# Patient Record
Sex: Female | Born: 1995 | Race: White | Hispanic: No | Marital: Single | Smoking: Never smoker
Health system: Southern US, Community
[De-identification: ages and names within clinical notes are randomized; demographics above are authoritative.]

## PROBLEM LIST (undated history)

## (undated) DIAGNOSIS — G40909 Epilepsy, unspecified, not intractable, without status epilepticus: Secondary | ICD-10-CM

---
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
## 2018-01-30 ENCOUNTER — Other Ambulatory Visit: Payer: Self-pay

## 2018-01-30 ENCOUNTER — Emergency Department (HOSPITAL_BASED_OUTPATIENT_CLINIC_OR_DEPARTMENT_OTHER): Payer: Worker's Compensation

## 2018-01-30 ENCOUNTER — Encounter (HOSPITAL_BASED_OUTPATIENT_CLINIC_OR_DEPARTMENT_OTHER): Payer: Self-pay | Admitting: Adult Health

## 2018-01-30 ENCOUNTER — Emergency Department (HOSPITAL_BASED_OUTPATIENT_CLINIC_OR_DEPARTMENT_OTHER)
Admission: EM | Admit: 2018-01-30 | Discharge: 2018-01-30 | Disposition: A | Discharge status: Home / Self Care | Payer: Worker's Compensation | Attending: Emergency Medicine | Admitting: Emergency Medicine

## 2018-01-30 DIAGNOSIS — S060X0A Concussion without loss of consciousness, initial encounter: Secondary | ICD-10-CM | POA: Diagnosis not present

## 2018-01-30 DIAGNOSIS — S0990XA Unspecified injury of head, initial encounter: Secondary | ICD-10-CM | POA: Diagnosis present

## 2018-01-30 DIAGNOSIS — Y9389 Activity, other specified: Secondary | ICD-10-CM | POA: Diagnosis not present

## 2018-01-30 DIAGNOSIS — W01198A Fall on same level from slipping, tripping and stumbling with subsequent striking against other object, initial encounter: Secondary | ICD-10-CM | POA: Insufficient documentation

## 2018-01-30 DIAGNOSIS — Y929 Unspecified place or not applicable: Secondary | ICD-10-CM | POA: Insufficient documentation

## 2018-01-30 DIAGNOSIS — Y990 Civilian activity done for income or pay: Secondary | ICD-10-CM | POA: Insufficient documentation

## 2018-01-30 HISTORY — DX: Epilepsy, unspecified, not intractable, without status epilepticus: G40.909

## 2018-01-30 NOTE — ED Triage Notes (Signed)
WHile working today for [employer], pt was in [employer] and ran into another coworker, it knocked her backwards and to the ground where the occiput of her head hit twice/ She endorses nasuea, increased pain and dizziness.

## 2018-01-30 NOTE — ED Provider Notes (Signed)
[facility] EMERGENCY DEPARTMENT Provider Note   CSN: [id number] Arrival date & time: 01/30/18  1616     History   Chief Complaint Chief Complaint  Patient presents with  . Head Injury    HPI Lollie MarrowBrittany Reppert is a 22 y.o. female.  HPI Patient presents after a fall in which she had her head.  States she bumped into someone at work and fell back, striking her head twice on the ground.  Since then has had a headache some nausea photophobia.  Also feels dizzy.  No confusion.  No vision changes.  No neck pain.  Mild mid back pain. Past Medical History:  Diagnosis Date  . Epilepsy (HCC)     There are no active problems to display for this patient.   History reviewed. No pertinent surgical history.   OB History   None      Home Medications    Prior to Admission medications   Medication Sig Start Date End Date Taking? Authorizing Provider  ibuprofen (ADVIL,MOTRIN) 200 MG tablet Take 400 mg by mouth every 6 (six) hours as needed. Patient used this medication for her headache.    [provider]    Family History History reviewed. No pertinent family history.  Social History Social History   Tobacco Use  . Smoking status: Never Smoker  . Smokeless tobacco: Never Used  Substance Use Topics  . Alcohol use: No  . Drug use: Not on file     Allergies   Patient has no known allergies.   Review of Systems Review of Systems  Constitutional: Negative for appetite change and fever.  HENT: Negative for congestion.   Eyes: Positive for photophobia.  Cardiovascular: Negative for chest pain.  Gastrointestinal: Positive for nausea. Negative for abdominal pain.  Genitourinary: Negative for enuresis.  Musculoskeletal: Positive for back pain.  Skin: Negative for rash.  Neurological: Positive for dizziness and headaches.  Hematological: Negative for adenopathy.  Psychiatric/Behavioral: Negative for confusion.     Physical Exam Updated Vital  Signs BP 121/81   Pulse 75   Temp 98.3 F (36.8 C) (Oral)   Resp 16   Ht 5\' 3"  (1.6 m)   Wt 64.9 kg (143 lb)   LMP 01/29/2018 (Exact Date)   SpO2 100%   BMI 25.33 kg/m   Physical Exam  Constitutional: She appears well-developed.  HENT:  Head: Normocephalic.  occipital tenderness.  Eyes: Pupils are equal, round, and reactive to light. EOM are normal.  Neck: Neck supple.  Cardiovascular: Normal rate.  Pulmonary/Chest: Effort normal.  Abdominal: There is no tenderness.  Musculoskeletal:  No cervical thoracic or lumbar spine tenderness.  No extremity tenderness.  Neurological: She is alert.  Skin: Skin is warm. Capillary refill takes less than 2 seconds.     ED Treatments / Results  Labs (all labs ordered are listed, but only abnormal results are displayed) Labs Reviewed - No data to display  EKG None  Radiology Ct Head Wo Contrast  Result Date: 01/30/2018 CLINICAL DATA:  Minor head trauma, remaining to co-worker and fell hitting the ground EXAM: CT HEAD WITHOUT CONTRAST TECHNIQUE: Contiguous axial images were obtained from the base of the skull through the vertex without intravenous contrast. COMPARISON:  None. FINDINGS: Brain: The ventricular system is normal in size and configuration and the septum is in a normal midline position. The fourth ventricle and basilar cisterns are unremarkable. No hemorrhage, mass lesion, or acute infarction is seen. Vascular: No vascular abnormality is noted on this  unenhanced study. Skull: On bone window images, no calvarial abnormality is seen. Sinuses/Orbits: The paranasal sinuses appear well pneumatized. Other: None. IMPRESSION: Negative unenhanced CT of the brain. Electronically Signed   By: Dwyane Dee M.D.   On: 01/30/2018 17:09    Procedures Procedures (including critical care time)  Medications Ordered in ED Medications - No data to display   Initial Impression / Assessment and Plan / ED Course  I have reviewed the triage vital  signs and the nursing notes.  Pertinent labs & imaging results that were available during my care of the patient were reviewed by me and considered in my medical decision making (see chart for details).     Patient with fall.  Clinical concussion.  Head CT done due to severity of symptoms.  Head CT reassuring.  Will discharge home.  Final Clinical Impressions(s) / ED Diagnoses   Final diagnoses:  Concussion without loss of consciousness, initial encounter  Injury of head, initial encounter    ED Discharge Orders    None       Benjiman Core, MD 01/30/18 2342

## 2019-06-20 IMAGING — CT CT HEAD W/O CM
3 series · 15 of 47 positions shown, 18 images · non-contrast
Comparison: None.

CLINICAL DATA: Minor head trauma, remaining to co-worker and fell
hitting the ground

EXAM:
CT HEAD WITHOUT CONTRAST
TECHNIQUE: Contiguous axial images were obtained from the base of the skull
through the vertex without intravenous contrast.

[Series 2: head wo · axial · 0.42mm/px · z∈[+1122,+1248]mm · 9 of 31 slices shown, 12 images]
[im 3/31  brain]
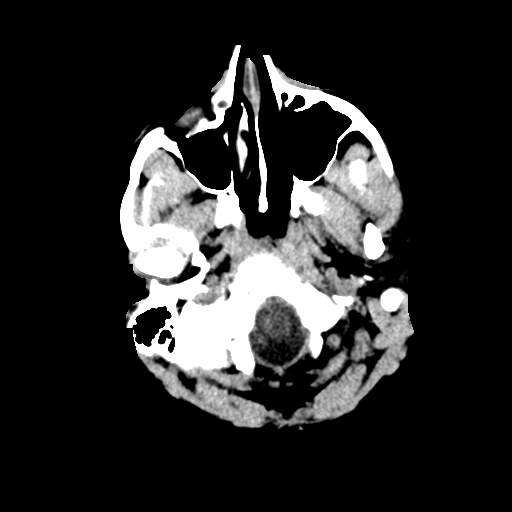
[im 3/31  bone]
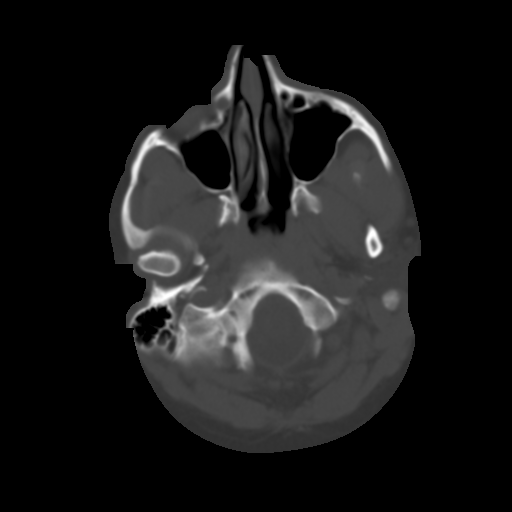
[im 6/31  brain]
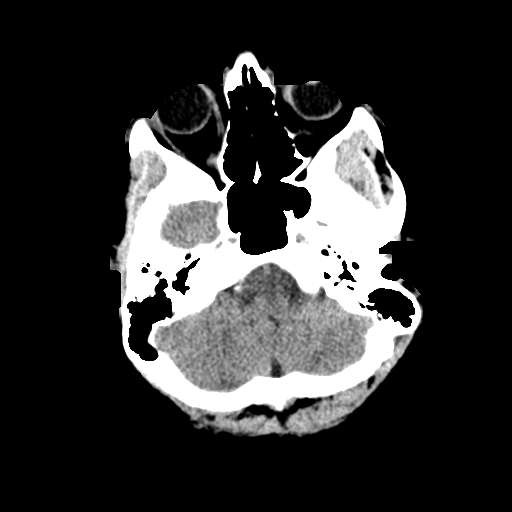
[im 9/31  brain]
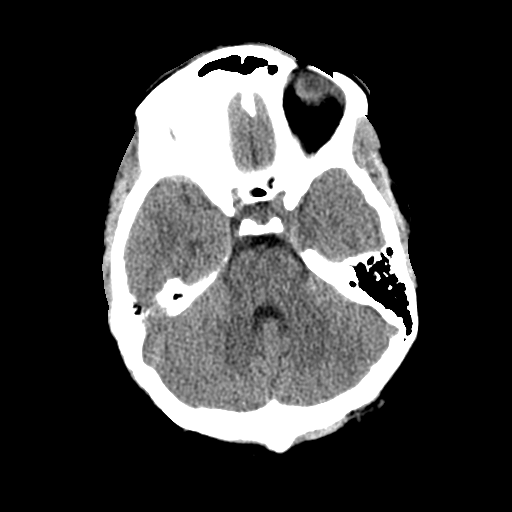
[im 12/31  brain]
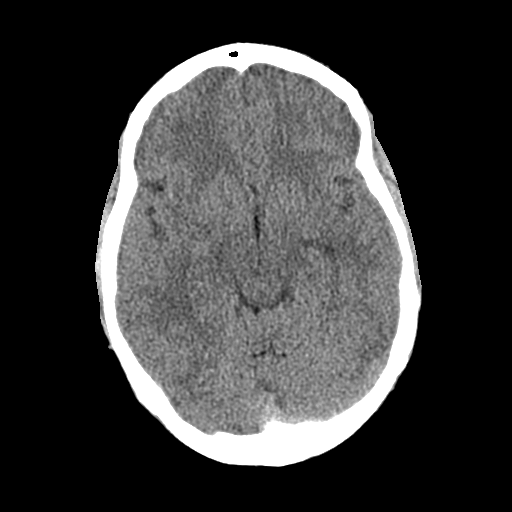
[im 16/31  brain]
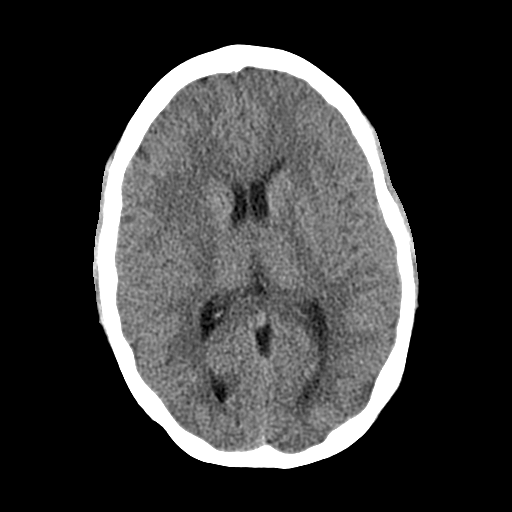
[im 16/31  bone]
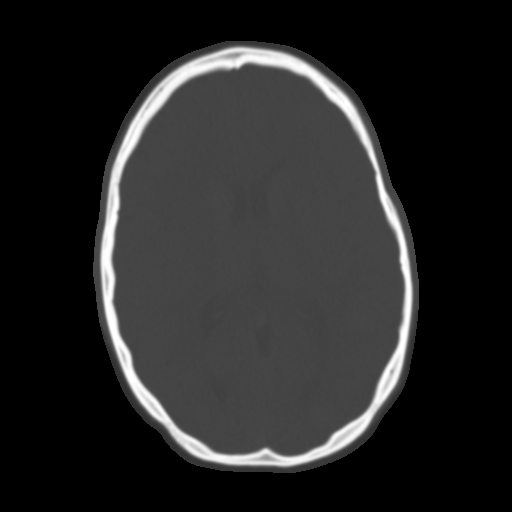
[im 19/31  brain]
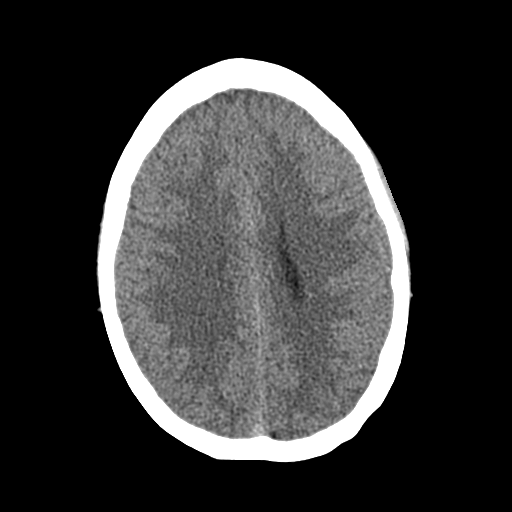
[im 22/31  brain]
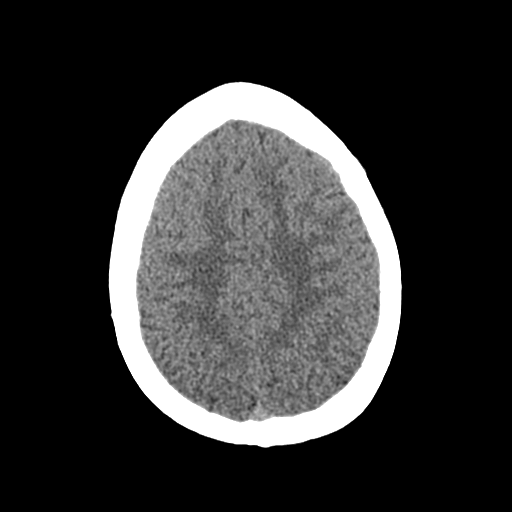
[im 25/31  brain]
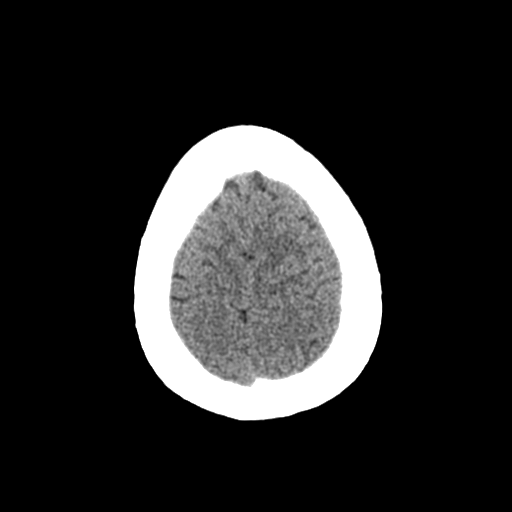
[im 28/31  brain]
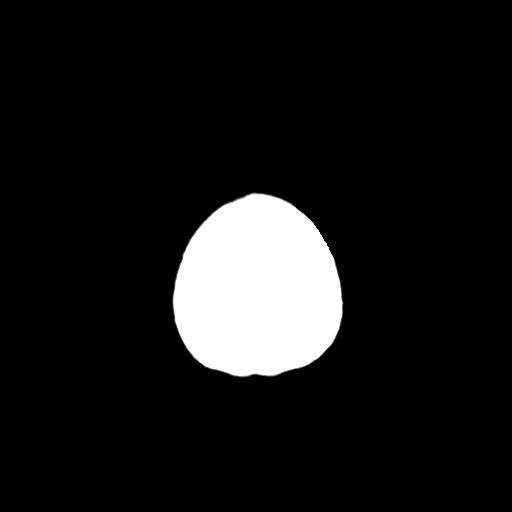
[im 28/31  bone]
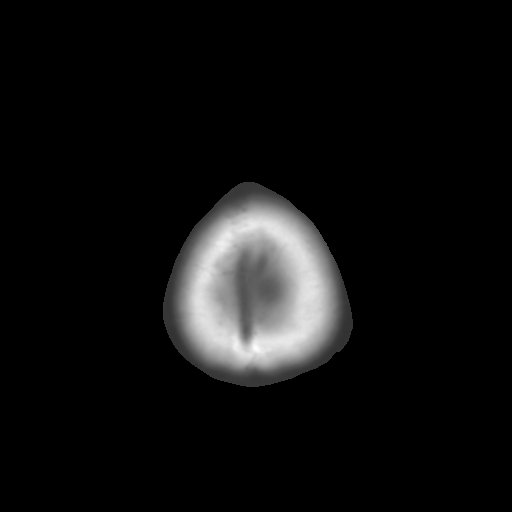

[Series 4: cor soft · coronal · 0.29mm/px · 3 of 73 slices shown]
[im 25/73  brain]
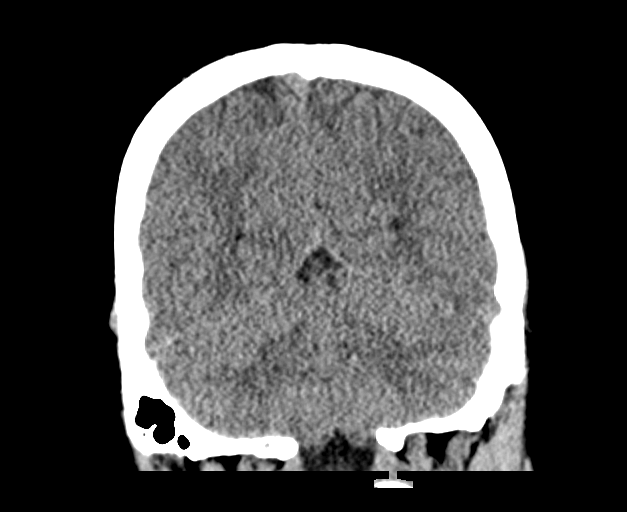
[im 33/73  brain]
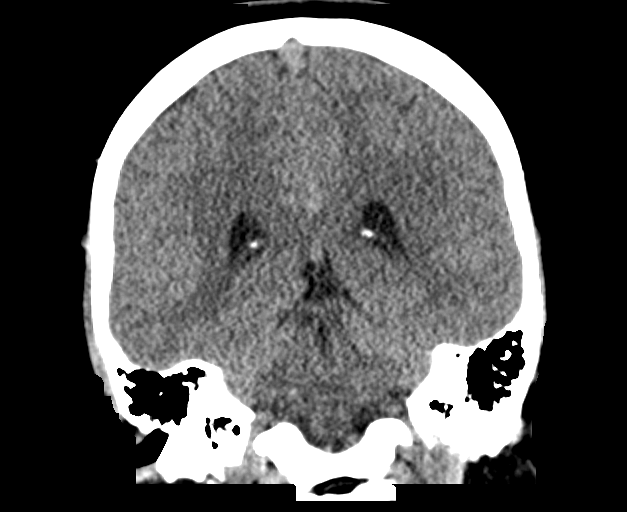
[im 41/73  brain]
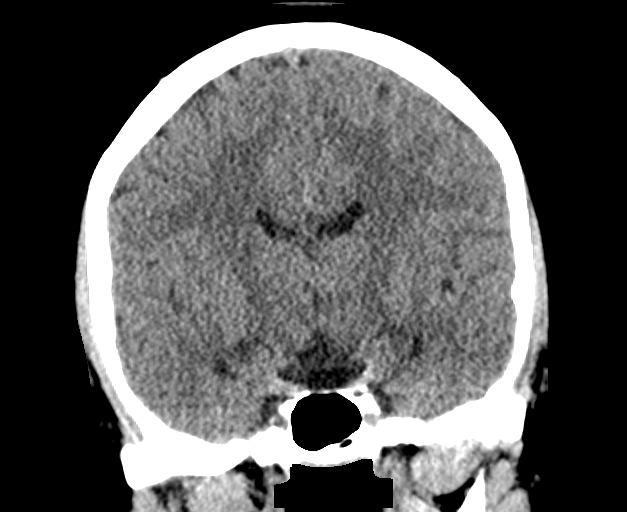

[Series 5: sag soft · sagittal · 0.29mm/px · 3 of 62 slices shown]
[im 21/62  brain]
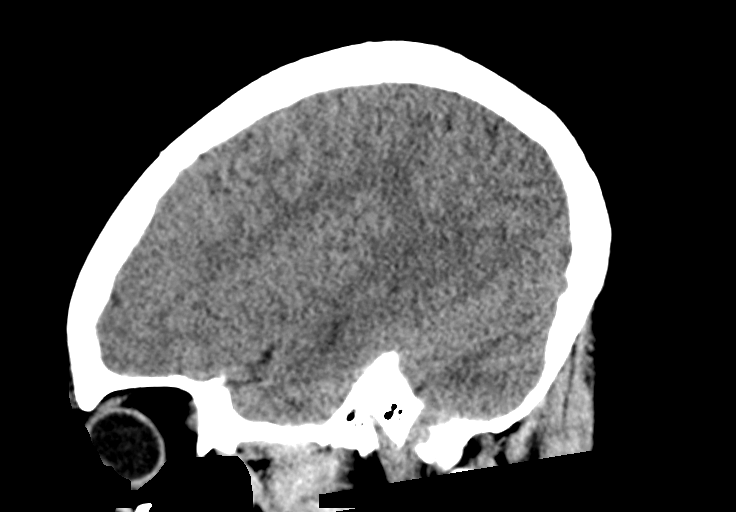
[im 31/62  brain]
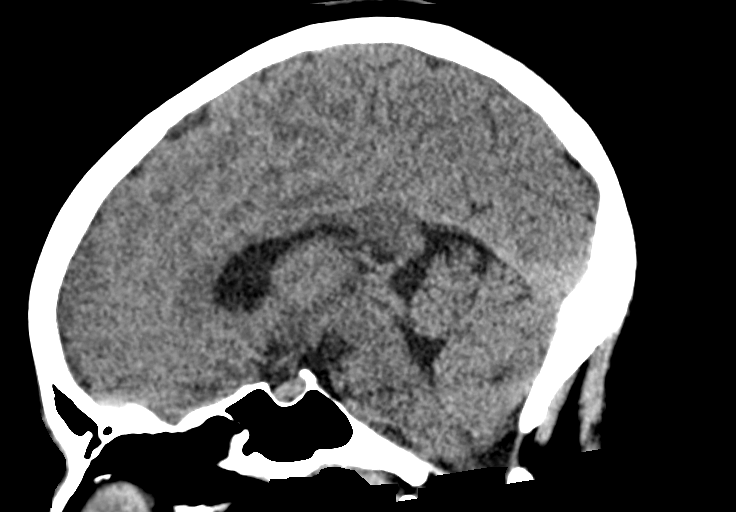
[im 41/62  brain]
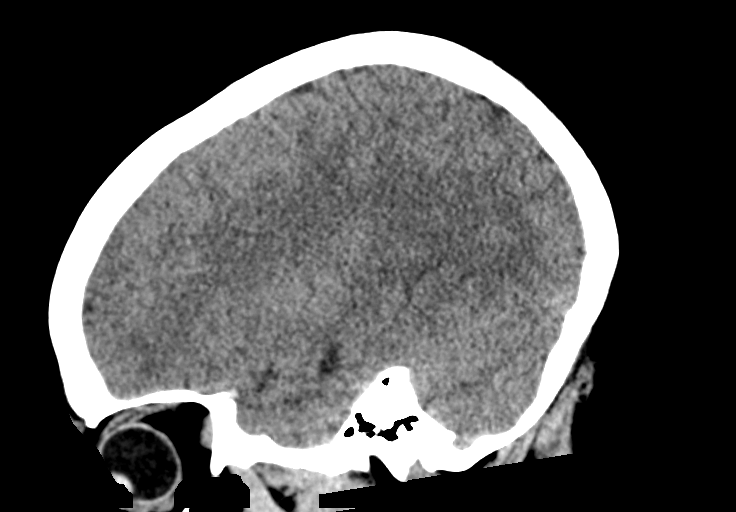

[15 of 47 positions shown; findings below may reference images not displayed]

FINDINGS: Brain: The ventricular system is normal in size and configuration
and the septum is in a normal midline position. The fourth ventricle
and basilar cisterns are unremarkable. No hemorrhage, mass lesion,
or acute infarction is seen.

Vascular: No vascular abnormality is noted on this unenhanced study.

Skull: On bone window images, no calvarial abnormality is seen.

Sinuses/Orbits: The paranasal sinuses appear well pneumatized.

Other: None.
IMPRESSION: Negative unenhanced CT of the brain.
